# Patient Record
Sex: Female | Born: 1950 | Race: Black or African American | Hispanic: No | Marital: Single
Health system: Southern US, Community
[De-identification: ages and names within clinical notes are randomized; demographics above are authoritative.]

## PROBLEM LIST (undated history)

## (undated) ENCOUNTER — Emergency Department (HOSPITAL_COMMUNITY): Discharge status: Absent without leave | Payer: Medicare HMO

---

## 2018-02-20 ENCOUNTER — Other Ambulatory Visit (HOSPITAL_COMMUNITY): Payer: Medicare HMO

## 2018-02-20 ENCOUNTER — Inpatient Hospital Stay
Admit: 2018-02-20 | Discharge: 2018-03-16 | Disposition: A | Discharge status: Home Health | Payer: Medicare HMO | Source: Other Acute Inpatient Hospital | Attending: Internal Medicine | Admitting: Internal Medicine

## 2018-02-20 DIAGNOSIS — J9690 Respiratory failure, unspecified, unspecified whether with hypoxia or hypercapnia: Secondary | ICD-10-CM

## 2018-02-20 DIAGNOSIS — R7989 Other specified abnormal findings of blood chemistry: Secondary | ICD-10-CM

## 2018-02-20 DIAGNOSIS — J90 Pleural effusion, not elsewhere classified: Secondary | ICD-10-CM

## 2018-02-20 DIAGNOSIS — IMO0002 Reserved for concepts with insufficient information to code with codable children: Secondary | ICD-10-CM

## 2018-02-21 LAB — COMPREHENSIVE METABOLIC PANEL
ALT: 86 U/L — ABNORMAL HIGH (ref 14–54)
AST: 132 U/L — AB (ref 15–41)
Albumin: 1.8 g/dL — ABNORMAL LOW (ref 3.5–5.0)
Alkaline Phosphatase: 81 U/L (ref 38–126)
Anion gap: 13 (ref 5–15)
BUN: 41 mg/dL — ABNORMAL HIGH (ref 6–20)
CHLORIDE: 99 mmol/L — AB (ref 101–111)
CO2: 27 mmol/L (ref 22–32)
Calcium: 7.8 mg/dL — ABNORMAL LOW (ref 8.9–10.3)
Creatinine, Ser: 3.9 mg/dL — ABNORMAL HIGH (ref 0.44–1.00)
GFR, EST AFRICAN AMERICAN: 13 mL/min — AB (ref >60)
GFR, EST NON AFRICAN AMERICAN: 11 mL/min — AB (ref >60)
Glucose, Bld: 94 mg/dL (ref 65–99)
Potassium: 3.9 mmol/L (ref 3.5–5.1)
Sodium: 139 mmol/L (ref 135–145)
Total Bilirubin: 0.8 mg/dL (ref 0.3–1.2)
Total Protein: 6.1 g/dL — ABNORMAL LOW (ref 6.5–8.1)

## 2018-02-21 LAB — CBC
HCT: 25.6 % — ABNORMAL LOW (ref 36.0–46.0)
Hemoglobin: 8.1 g/dL — ABNORMAL LOW (ref 12.0–15.0)
MCH: 30 pg (ref 26.0–34.0)
MCHC: 31.6 g/dL (ref 30.0–36.0)
MCV: 94.8 fL (ref 78.0–100.0)
PLATELETS: 153 10*3/uL (ref 150–400)
RBC: 2.7 MIL/uL — ABNORMAL LOW (ref 3.87–5.11)
RDW: 17.2 % — ABNORMAL HIGH (ref 11.5–15.5)
WBC: 7.9 10*3/uL (ref 4.0–10.5)

## 2018-02-23 LAB — BASIC METABOLIC PANEL
Anion gap: 15 (ref 5–15)
BUN: 37 mg/dL — AB (ref 6–20)
CALCIUM: 7.7 mg/dL — AB (ref 8.9–10.3)
CO2: 23 mmol/L (ref 22–32)
CREATININE: 3.33 mg/dL — AB (ref 0.44–1.00)
Chloride: 102 mmol/L (ref 101–111)
GFR, EST AFRICAN AMERICAN: 16 mL/min — AB (ref >60)
GFR, EST NON AFRICAN AMERICAN: 13 mL/min — AB (ref >60)
Glucose, Bld: 85 mg/dL (ref 65–99)
Potassium: 3.7 mmol/L (ref 3.5–5.1)
SODIUM: 140 mmol/L (ref 135–145)

## 2018-02-23 LAB — CBC
HCT: 24.2 % — ABNORMAL LOW (ref 36.0–46.0)
Hemoglobin: 7.5 g/dL — ABNORMAL LOW (ref 12.0–15.0)
MCH: 28.5 pg (ref 26.0–34.0)
MCHC: 31 g/dL (ref 30.0–36.0)
MCV: 92 fL (ref 78.0–100.0)
PLATELETS: 150 10*3/uL (ref 150–400)
RBC: 2.63 MIL/uL — AB (ref 3.87–5.11)
RDW: 17 % — AB (ref 11.5–15.5)
WBC: 6.6 10*3/uL (ref 4.0–10.5)

## 2018-02-26 LAB — BASIC METABOLIC PANEL
Anion gap: 11 (ref 5–15)
BUN: 30 mg/dL — AB (ref 6–20)
CALCIUM: 8.4 mg/dL — AB (ref 8.9–10.3)
CO2: 25 mmol/L (ref 22–32)
Chloride: 108 mmol/L (ref 101–111)
Creatinine, Ser: 2.29 mg/dL — ABNORMAL HIGH (ref 0.44–1.00)
GFR calc Af Amer: 24 mL/min — ABNORMAL LOW (ref >60)
GFR, EST NON AFRICAN AMERICAN: 21 mL/min — AB (ref >60)
Glucose, Bld: 121 mg/dL — ABNORMAL HIGH (ref 65–99)
Potassium: 3.2 mmol/L — ABNORMAL LOW (ref 3.5–5.1)
Sodium: 144 mmol/L (ref 135–145)

## 2018-03-06 LAB — BASIC METABOLIC PANEL
Anion gap: 12 (ref 5–15)
BUN: 73 mg/dL — AB (ref 6–20)
CHLORIDE: 108 mmol/L (ref 101–111)
CO2: 20 mmol/L — AB (ref 22–32)
CREATININE: 3.49 mg/dL — AB (ref 0.44–1.00)
Calcium: 7.8 mg/dL — ABNORMAL LOW (ref 8.9–10.3)
GFR calc Af Amer: 15 mL/min — ABNORMAL LOW (ref >60)
GFR calc non Af Amer: 13 mL/min — ABNORMAL LOW (ref >60)
Glucose, Bld: 195 mg/dL — ABNORMAL HIGH (ref 65–99)
Potassium: 3.6 mmol/L (ref 3.5–5.1)
Sodium: 140 mmol/L (ref 135–145)

## 2018-03-06 LAB — CBC
HEMATOCRIT: 27 % — AB (ref 36.0–46.0)
HEMOGLOBIN: 8.5 g/dL — AB (ref 12.0–15.0)
MCH: 29.1 pg (ref 26.0–34.0)
MCHC: 31.5 g/dL (ref 30.0–36.0)
MCV: 92.5 fL (ref 78.0–100.0)
Platelets: 133 10*3/uL — ABNORMAL LOW (ref 150–400)
RBC: 2.92 MIL/uL — ABNORMAL LOW (ref 3.87–5.11)
RDW: 17.7 % — ABNORMAL HIGH (ref 11.5–15.5)
WBC: 9.4 10*3/uL (ref 4.0–10.5)

## 2018-03-09 ENCOUNTER — Other Ambulatory Visit (HOSPITAL_COMMUNITY): Payer: Medicare HMO

## 2018-03-09 LAB — BASIC METABOLIC PANEL
Anion gap: 9 (ref 5–15)
BUN: 69 mg/dL — AB (ref 6–20)
CALCIUM: 8.1 mg/dL — AB (ref 8.9–10.3)
CO2: 20 mmol/L — ABNORMAL LOW (ref 22–32)
CREATININE: 3.47 mg/dL — AB (ref 0.44–1.00)
Chloride: 111 mmol/L (ref 101–111)
GFR calc Af Amer: 15 mL/min — ABNORMAL LOW (ref >60)
GFR, EST NON AFRICAN AMERICAN: 13 mL/min — AB (ref >60)
GLUCOSE: 176 mg/dL — AB (ref 65–99)
Potassium: 3.6 mmol/L (ref 3.5–5.1)
SODIUM: 140 mmol/L (ref 135–145)

## 2018-03-10 ENCOUNTER — Other Ambulatory Visit (HOSPITAL_COMMUNITY): Payer: Medicare HMO

## 2018-03-10 ENCOUNTER — Encounter (HOSPITAL_COMMUNITY): Payer: Self-pay | Admitting: Student

## 2018-03-10 HISTORY — PX: IR THORACENTESIS RIGHT ASP PLEURAL SPACE W/IMG GUIDE: IMG5380

## 2018-03-10 MED ORDER — LIDOCAINE HCL (PF) 2 % IJ SOLN
INTRAMUSCULAR | Status: AC
  Filled 2018-03-10: qty 20

## 2018-03-10 MED ORDER — LIDOCAINE HCL (PF) 2 % IJ SOLN
INTRAMUSCULAR | Status: DC | PRN
  Administered 2018-03-10: 10 mL

## 2018-03-10 NOTE — Procedures (Signed)
PROCEDURE SUMMARY:  Successful US guided therapeutic right thoracentesis. Yielded 1100 mL of blood tinged fluid. Pt tolerated procedure well. No immediate complications.  Specimen was not sent for labs. CXR ordered.  Hoyt KochKacie Sue-Ellen Matthews PA-C 03/10/2018 4:43 PM

## 2018-03-11 LAB — POTASSIUM: Potassium: 3.9 mmol/L (ref 3.5–5.1)

## 2018-03-14 LAB — CBC
HCT: 29.6 % — ABNORMAL LOW (ref 36.0–46.0)
Hemoglobin: 9.2 g/dL — ABNORMAL LOW (ref 12.0–15.0)
MCH: 29.2 pg (ref 26.0–34.0)
MCHC: 31.1 g/dL (ref 30.0–36.0)
MCV: 94 fL (ref 78.0–100.0)
PLATELETS: 123 10*3/uL — AB (ref 150–400)
RBC: 3.15 MIL/uL — AB (ref 3.87–5.11)
RDW: 17.7 % — ABNORMAL HIGH (ref 11.5–15.5)
WBC: 10.5 10*3/uL (ref 4.0–10.5)

## 2018-03-14 LAB — BASIC METABOLIC PANEL
ANION GAP: 8 (ref 5–15)
BUN: 48 mg/dL — ABNORMAL HIGH (ref 6–20)
CALCIUM: 7.9 mg/dL — AB (ref 8.9–10.3)
CO2: 18 mmol/L — AB (ref 22–32)
CREATININE: 3.04 mg/dL — AB (ref 0.44–1.00)
Chloride: 111 mmol/L (ref 101–111)
GFR calc Af Amer: 17 mL/min — ABNORMAL LOW (ref >60)
GFR, EST NON AFRICAN AMERICAN: 15 mL/min — AB (ref >60)
Glucose, Bld: 105 mg/dL — ABNORMAL HIGH (ref 65–99)
Potassium: 4.4 mmol/L (ref 3.5–5.1)
SODIUM: 137 mmol/L (ref 135–145)

## 2018-05-31 DEATH — deceased

## 2018-08-08 IMAGING — US IR THORACENTESIS ASP PLEURAL SPACE W/IMG GUIDE
1 series · 2 of 2 positions shown · non-contrast
Comparison: none

INDICATION: Patient with shortness of breath, pleural effusion. Request is made
for therapeutic right thoracentesis.

[Series 1: ir rad eval and mgt. · 2 of 2 slices shown]
[im 1/2]
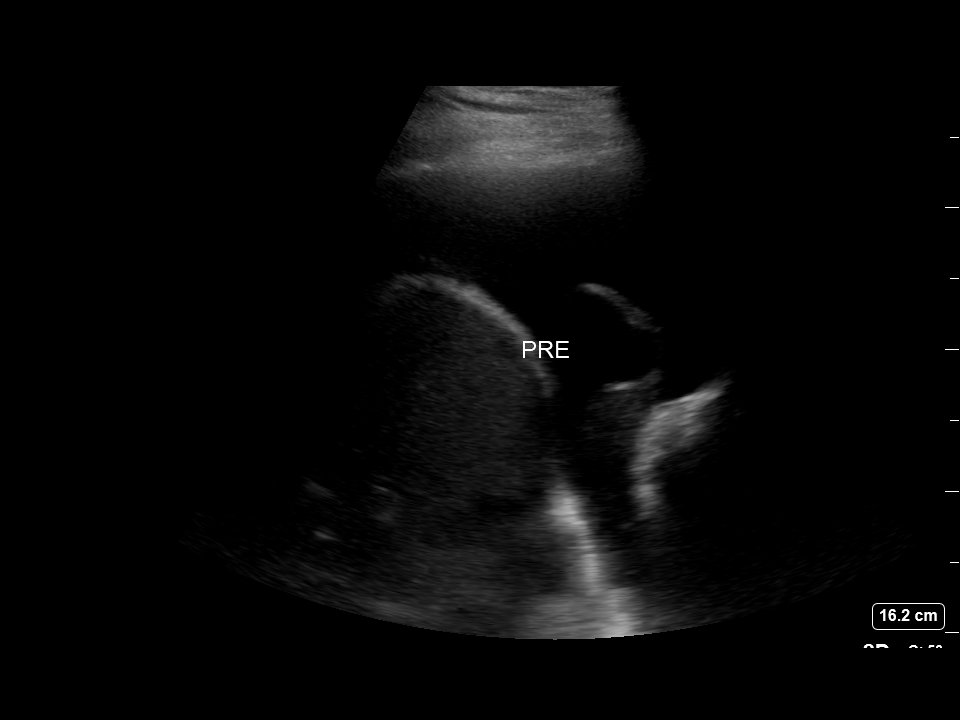
[im 2/2]
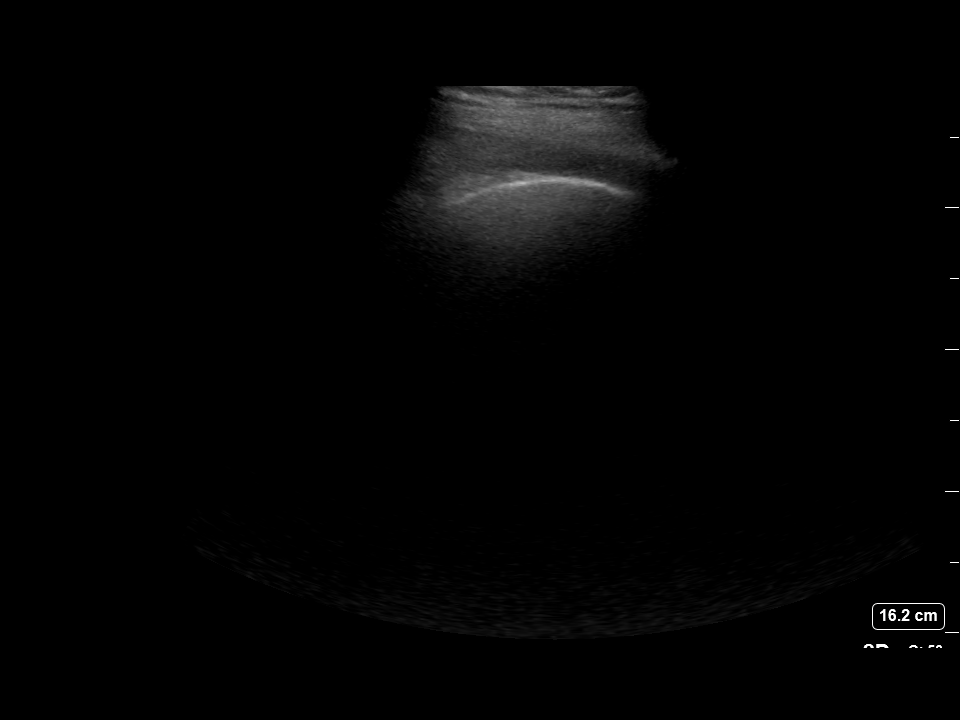

[2 of 2 positions shown; findings below may reference images not displayed]

EXAM:
ULTRASOUND GUIDED THERAPEUTIC RIGHT THORACENTESIS

MEDICATIONS:
10 mL 2% lidocaine

COMPLICATIONS:
None immediate.

PROCEDURE:
An ultrasound guided thoracentesis was thoroughly discussed with the
patient and questions answered. The benefits, risks, alternatives
and complications were also discussed. The patient understands and
wishes to proceed with the procedure. Written consent was obtained.

Ultrasound was performed to localize and mark an adequate pocket of
fluid in the right chest. The area was then prepped and draped in
the normal sterile fashion. 1% Lidocaine was used for local
anesthesia. Under ultrasound guidance a Safe-T-Centesis catheter was
introduced. Thoracentesis was performed. The catheter was removed
and a dressing applied.
FINDINGS: A total of approximately 7755 mL of blood-tinged fluid was removed.
IMPRESSION: Successful ultrasound guided therapeutic right thoracentesis
yielding 7755 mL of pleural fluid.

## 2018-08-08 IMAGING — DX DG CHEST 1V
1 series · 1 of 1 positions shown · non-contrast
Comparison: Chest x-ray of March 09, 2018

CLINICAL DATA: Status post right-sided thoracentesis with removal
of 7777 cc.

EXAM:
CHEST  1 VIEW

[chest ap]
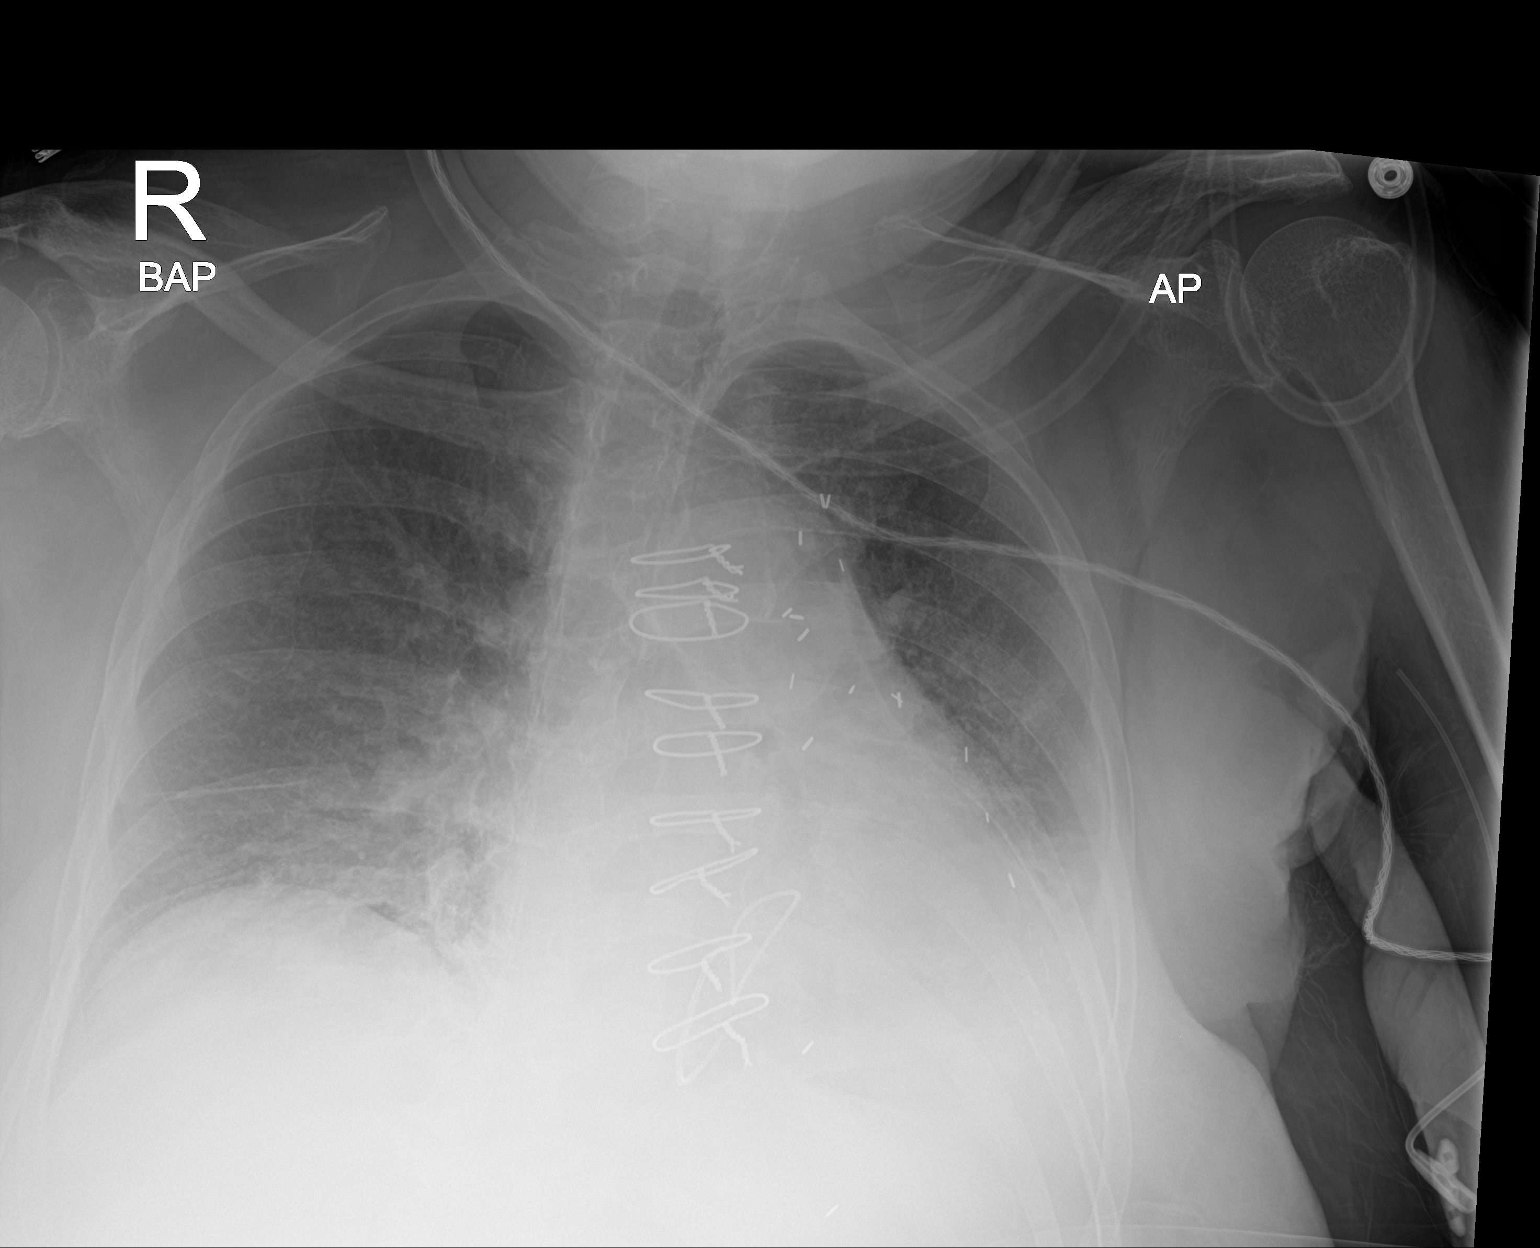

[1 of 1 positions shown; findings below may reference images not displayed]

FINDINGS: The volume of pleural fluid on the right has markedly decreased.
There is no right-sided pneumothorax. On the left there remains a
small pleural effusion. The interstitial markings of both lungs are
mildly increased but this is stable. The cardiac silhouette is
enlarged. The pulmonary vascularity is indistinct. The sternal wires
are intact. There are prosthetic valve rings in the tricuspid and
mitral positions. There is calcification in the wall of the aortic
arch.
IMPRESSION: No postprocedure pneumothorax following right-sided thoracentesis.
Improved aeration of the right lung.

## 2020-01-30 IMAGING — US US RENAL
1 series · 14 of 25 positions shown · non-contrast
Comparison: None.

CLINICAL DATA: Elevated creatinine.

EXAM:
RENAL / URINARY TRACT ULTRASOUND COMPLETE

[Series 1: us renal · 0.24mm/px · 14 of 44 slices shown]
[im 1/44]
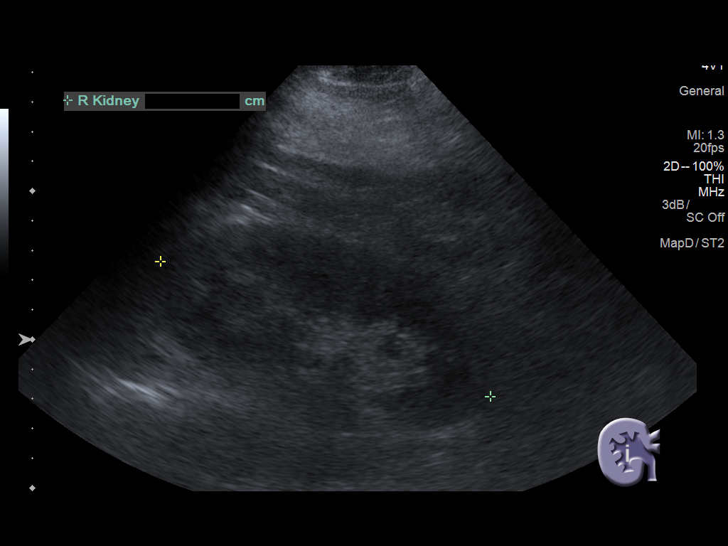
[im 4/44]
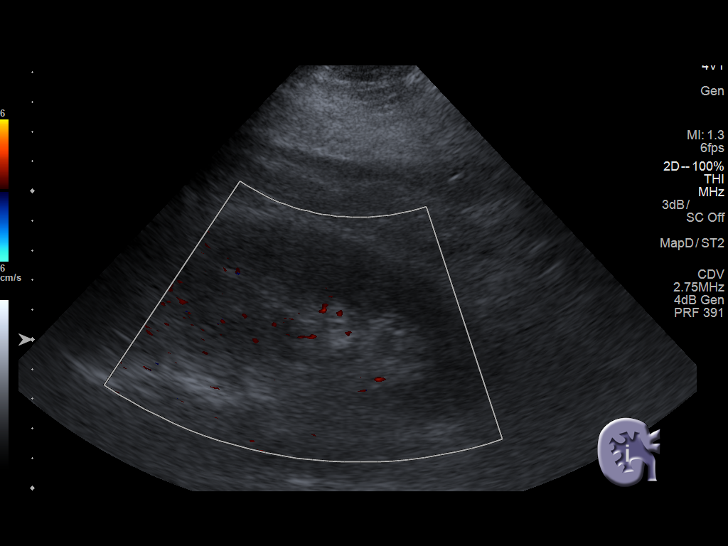
[im 8/44]
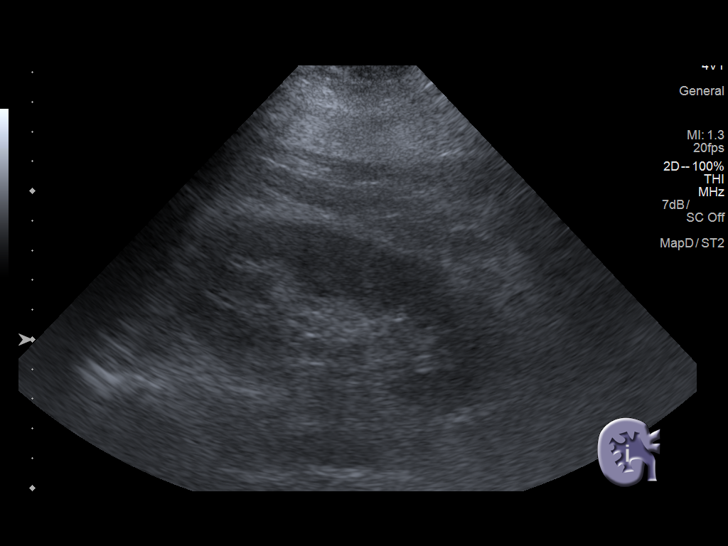
[im 11/44]
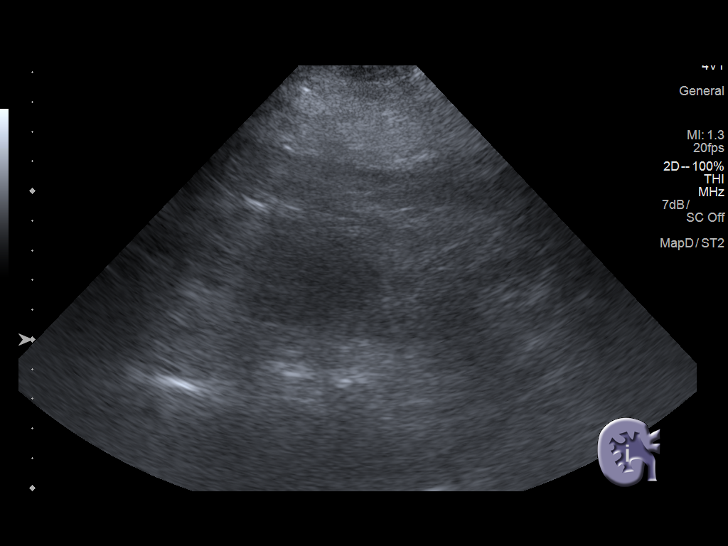
[im 15/44]
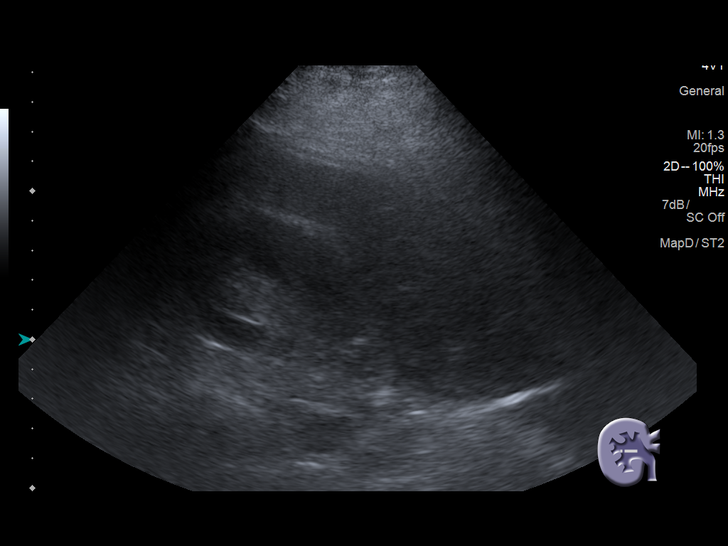
[im 17/44]
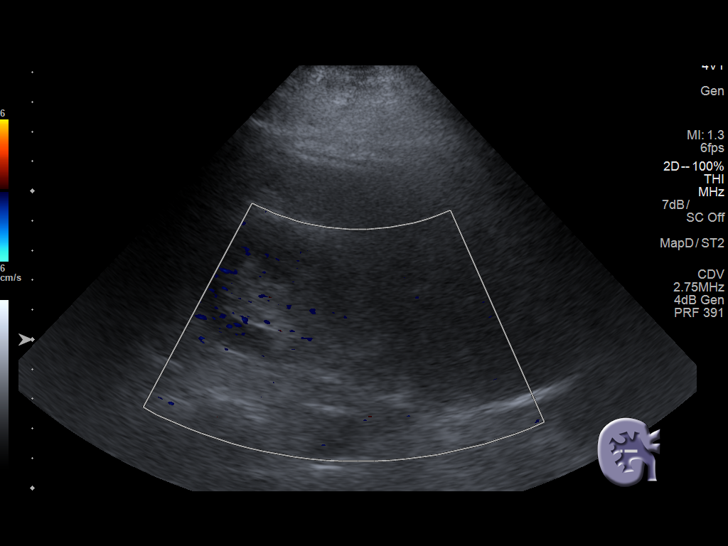
[im 20/44]
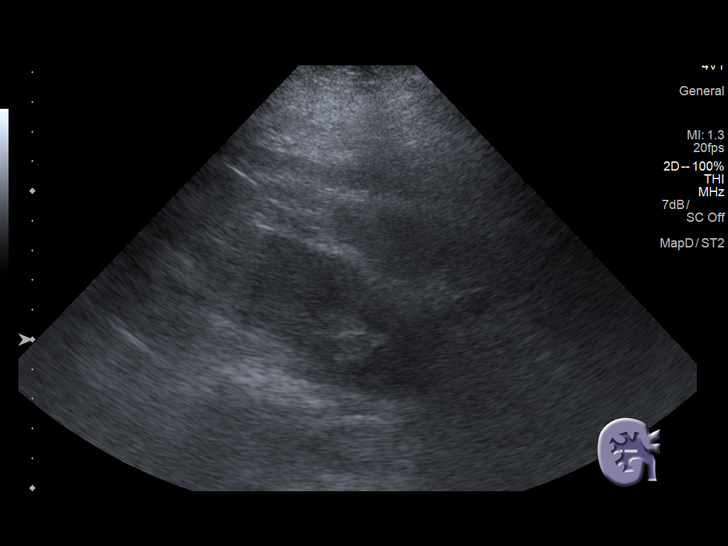
[im 24/44]
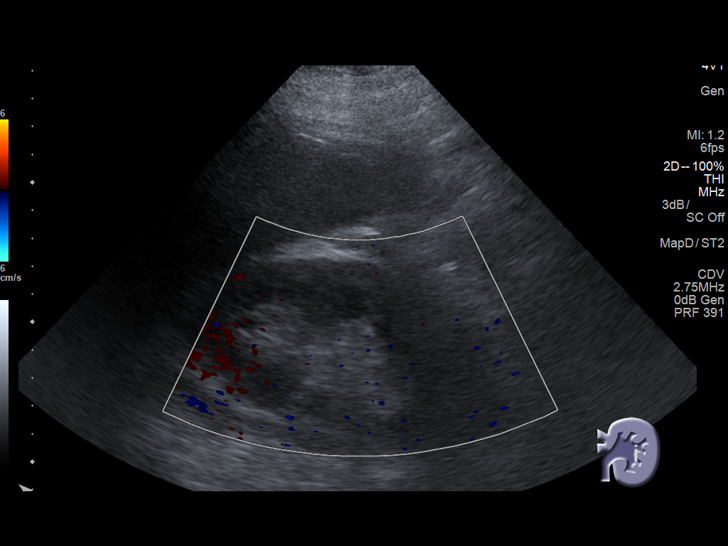
[im 27/44]
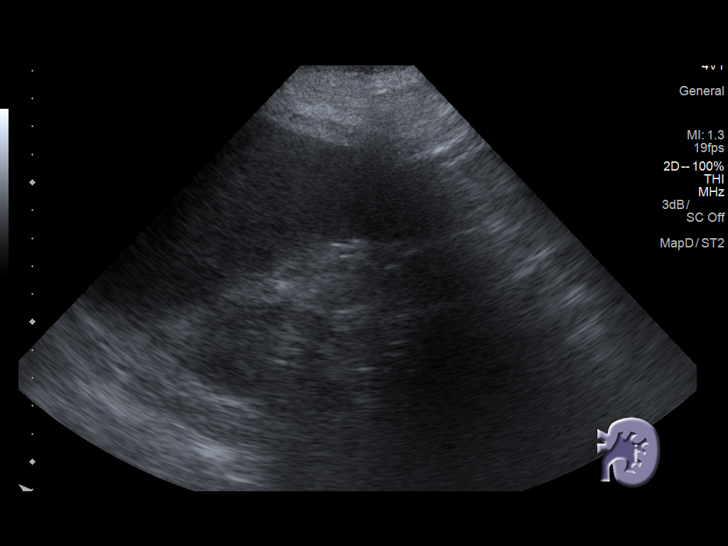
[im 29/44]
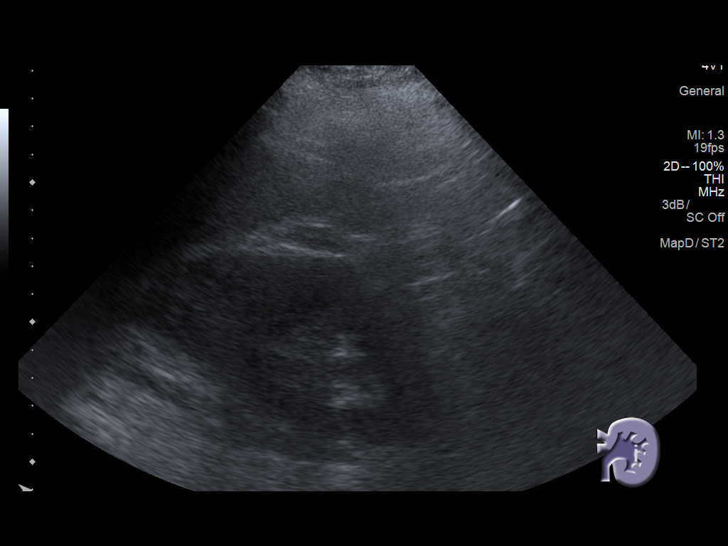
[im 33/44]
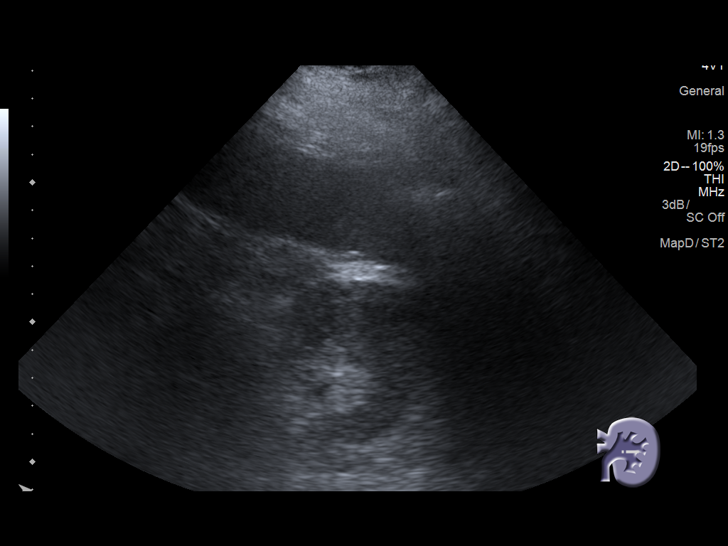
[im 36/44]
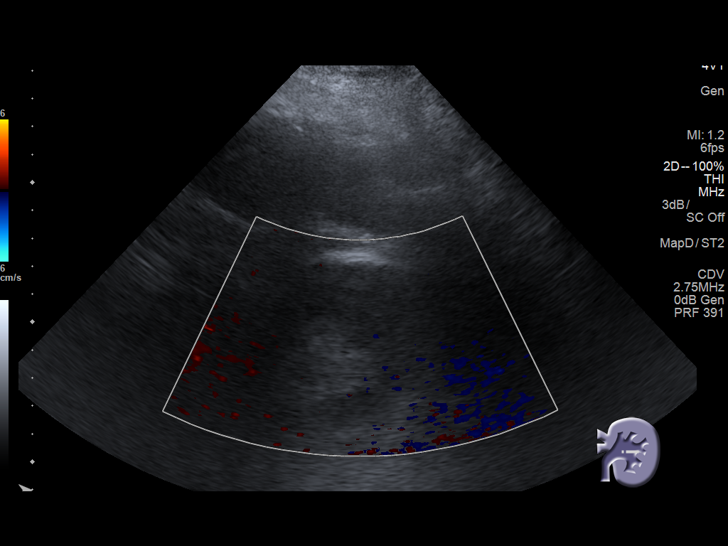
[im 40/44]
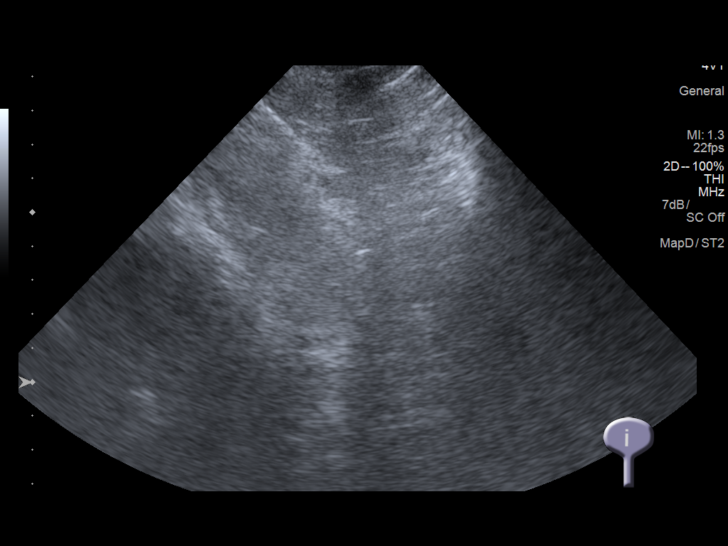
[im 44/44]
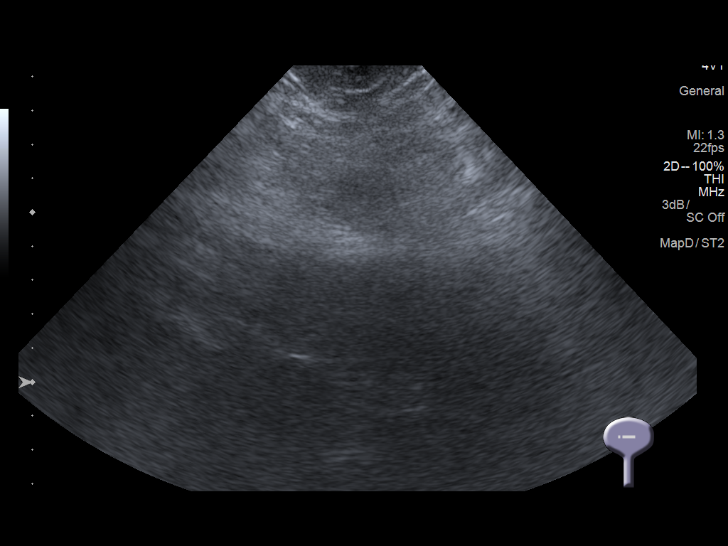

[14 of 25 positions shown; findings below may reference images not displayed]

FINDINGS: Right Kidney:

Length: 12 cm. Echogenicity within normal limits. No mass or
hydronephrosis visualized.

Left Kidney:

Length: 12.4 cm. Echogenicity within normal limits. No mass or
hydronephrosis visualized.

Bladder:

Decompressed by Foley catheter and not evaluated.
IMPRESSION: Unremarkable renal ultrasound.  No obstructive uropathy.
